# Patient Record
Sex: Female | Born: 2000 | Race: White | Hispanic: No | Marital: Single | Smoking: Current every day smoker
Health system: Southern US, Community
[De-identification: ages and names within clinical notes are randomized; demographics above are authoritative.]

## PROBLEM LIST (undated history)

## (undated) DIAGNOSIS — Z789 Other specified health status: Secondary | ICD-10-CM

## (undated) DIAGNOSIS — F419 Anxiety disorder, unspecified: Secondary | ICD-10-CM

## (undated) DIAGNOSIS — F32A Depression, unspecified: Secondary | ICD-10-CM

## (undated) HISTORY — PX: OTHER SURGICAL HISTORY: SHX169

## (undated) HISTORY — DX: Anxiety disorder, unspecified: F41.9

## (undated) HISTORY — DX: Depression, unspecified: F32.A

## (undated) HISTORY — DX: Other specified health status: Z78.9

---
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
## 2022-05-01 IMAGING — DX DG ABDOMEN 1V
1 series · 1 of 1 positions shown · non-contrast
Comparison: None.

CLINICAL DATA: Lower abdominal pain.

EXAM:
ABDOMEN - 1 VIEW

[abdomen supine]
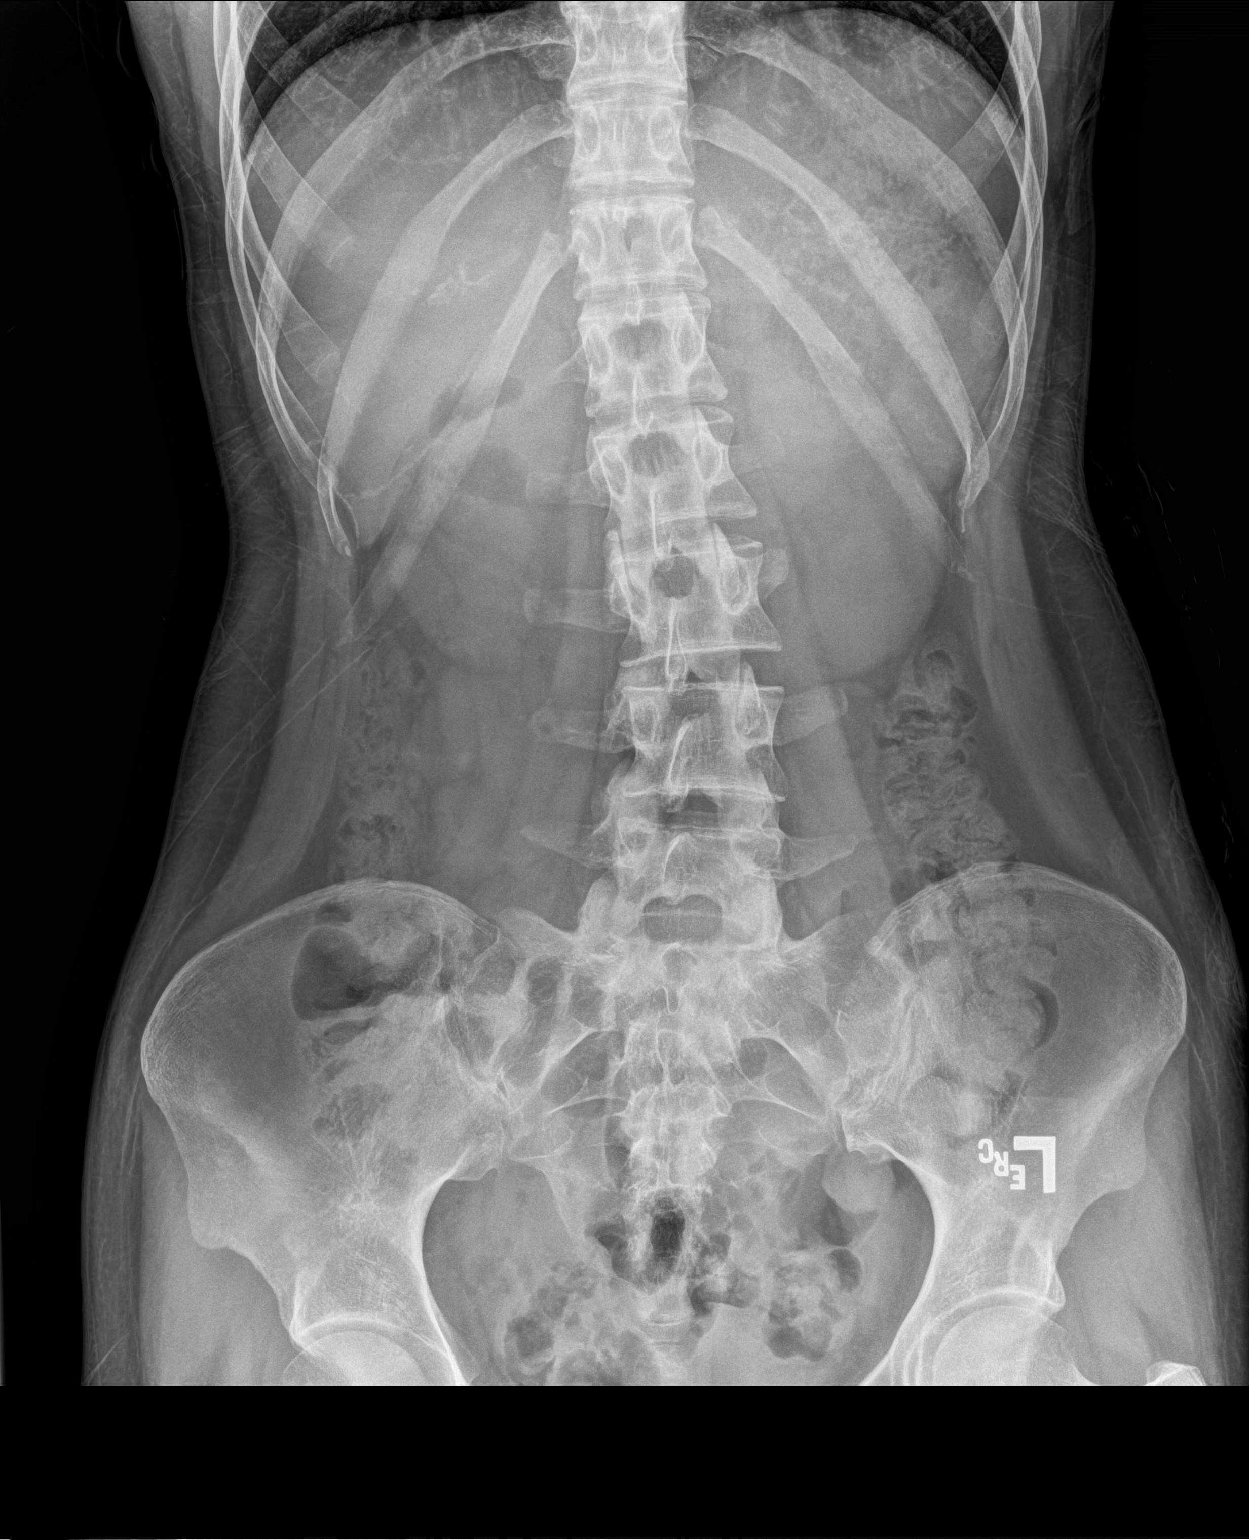

[1 of 1 positions shown; findings below may reference images not displayed]

FINDINGS: Normal bowel gas pattern. No evidence of free air. Small volume of
colonic stool. No radiopaque calculi or abnormal soft tissue
calcifications. Lower most lung bases are clear. Slight scoliotic
curvature of the upper lumbar spine. No acute osseous abnormalities.
IMPRESSION: Unremarkable radiograph of the abdomen.

## 2022-05-01 IMAGING — US US PELVIS COMPLETE
1 series · 14 of 25 positions shown · non-contrast
Comparison: Corresponding radiograph from the same day.

CLINICAL DATA: Initial evaluation for acute right lower quadrant
abdominal pain.

EXAM:
TRANSABDOMINAL ULTRASOUND OF PELVIS
DOPPLER ULTRASOUND OF OVARIES
TECHNIQUE: Transabdominal ultrasound examination of the pelvis was performed
including evaluation of the uterus, ovaries, adnexal regions, and
pelvic cul-de-sac.
Color and duplex Doppler ultrasound was utilized to evaluate blood
flow to the ovaries.

[Series 1002: gyn us · 14 of 40 slices shown]
[im 1/40]
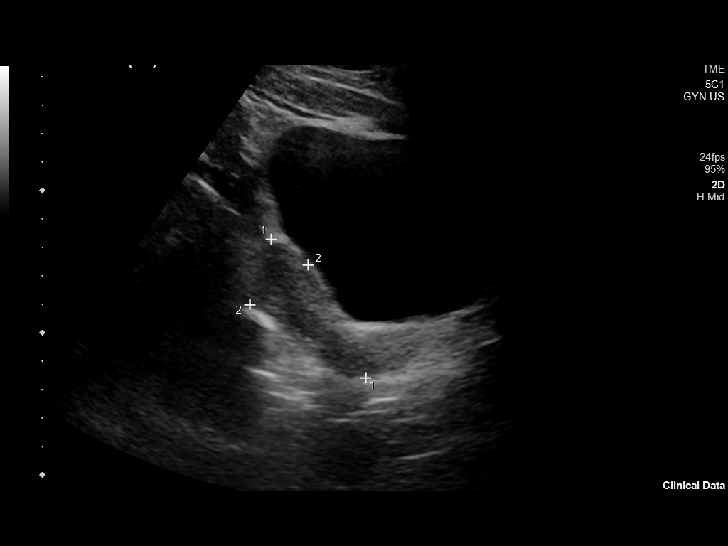
[im 4/40]
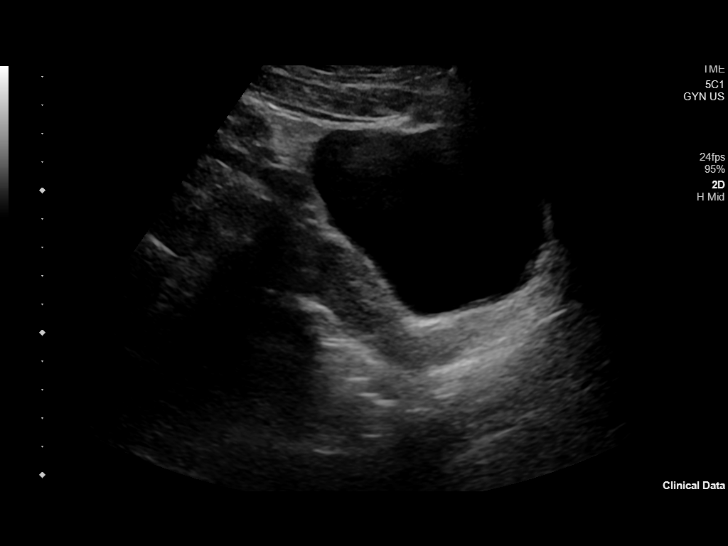
[im 7/40]
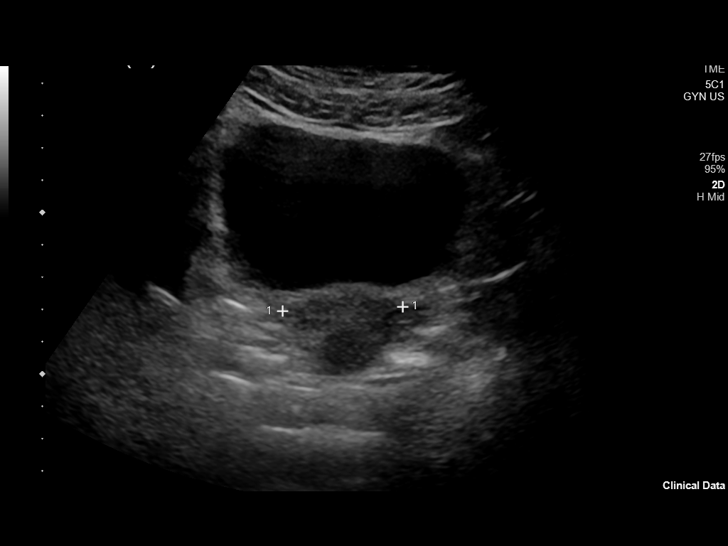
[im 10/40]
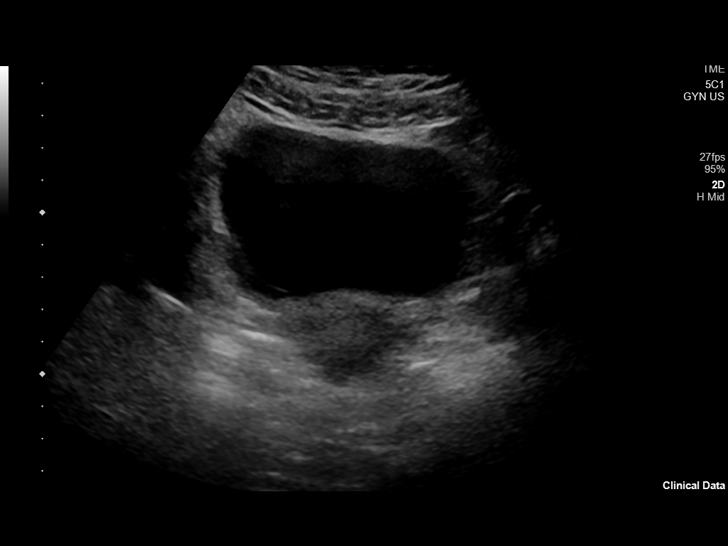
[im 14/40]
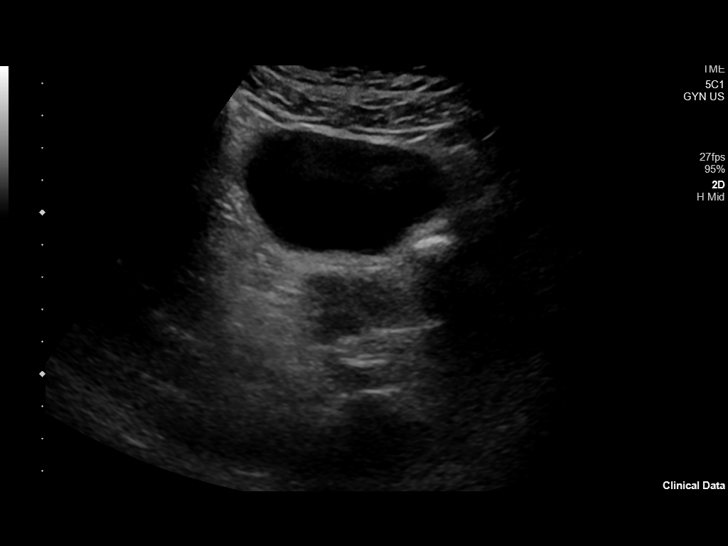
[im 15/40]
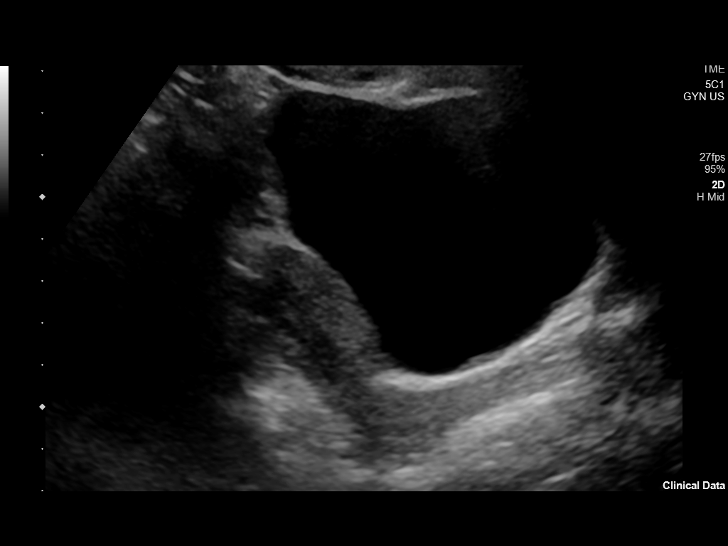
[im 18/40]
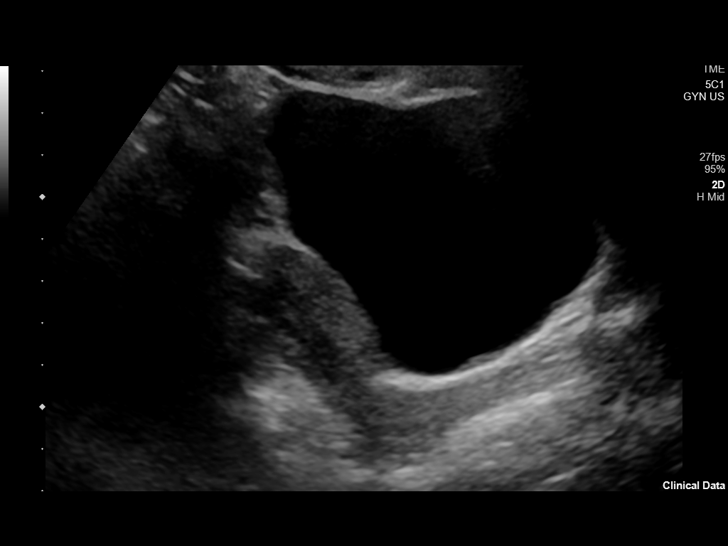
[im 22/40]
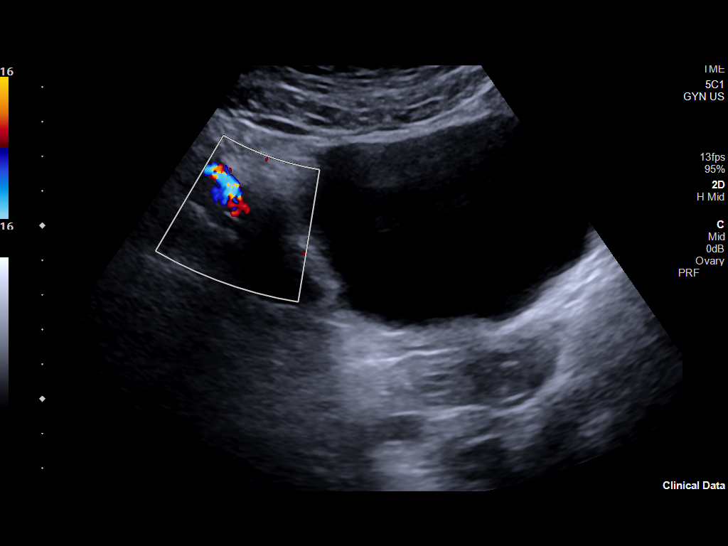
[im 25/40]
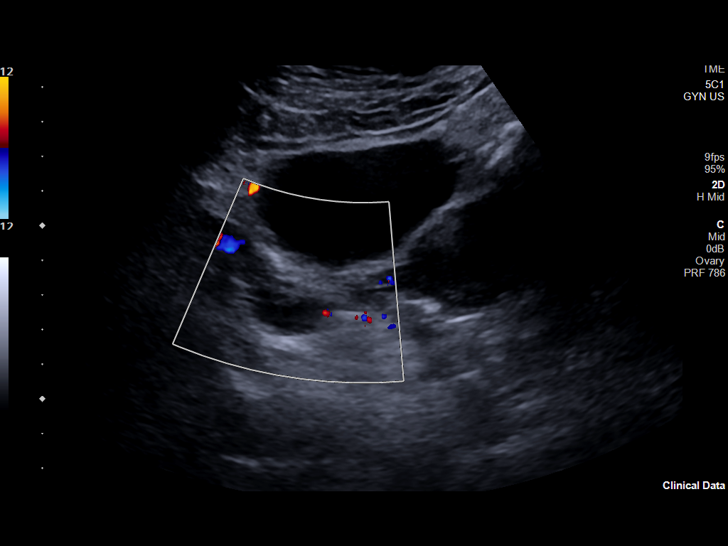
[im 27/40]
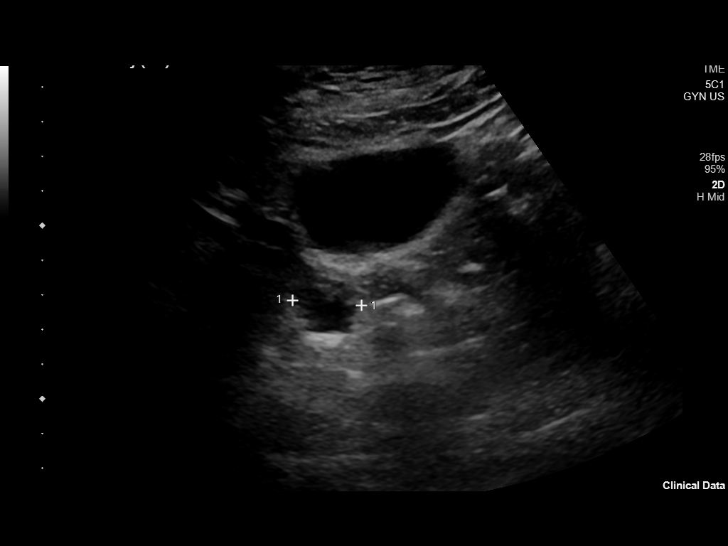
[im 30/40]
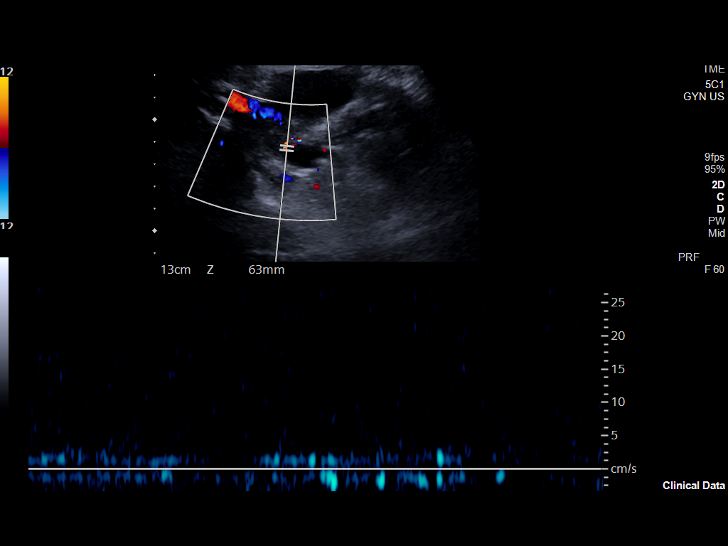
[im 33/40]
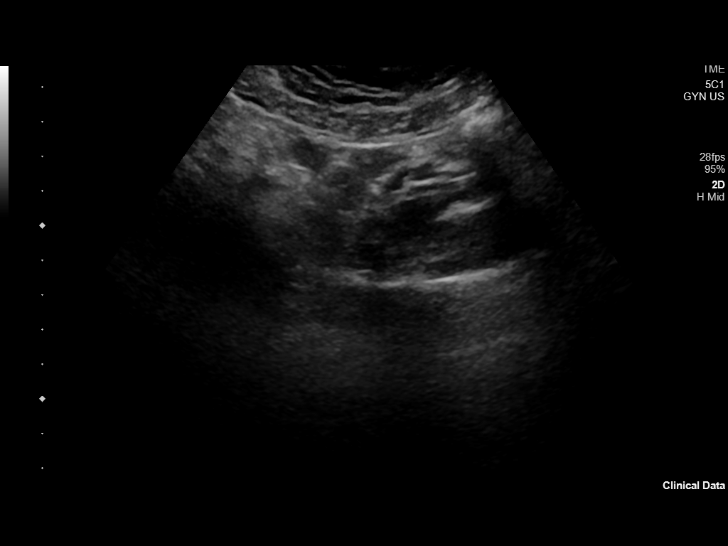
[im 36/40]
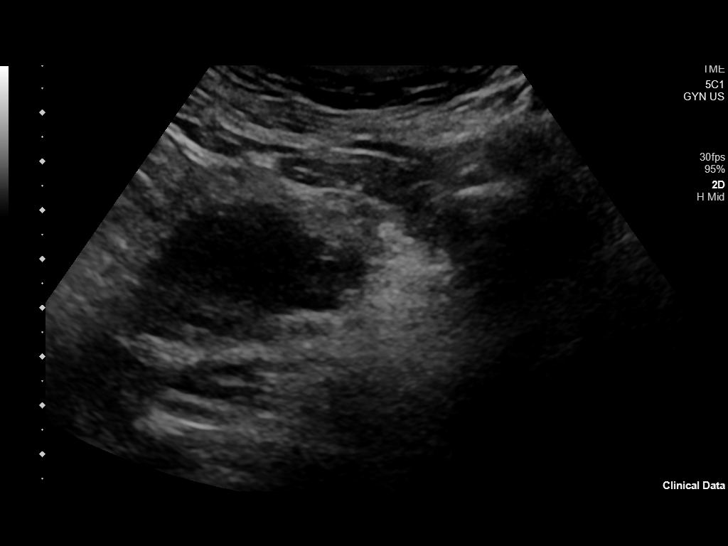
[im 40/40]
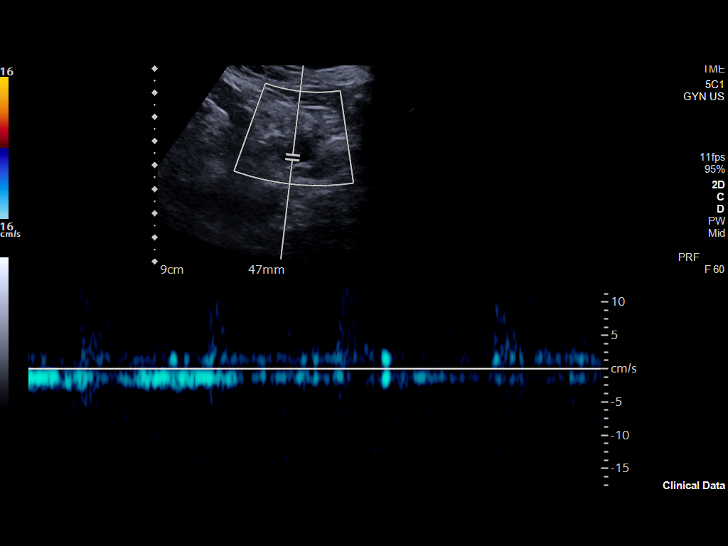

[14 of 25 positions shown; findings below may reference images not displayed]

FINDINGS: Uterus

Measurements: 5.9 x 2.5 x 3.7 cm = volume: 28.5 mL. No fibroids or
other mass visualized.

Endometrium

Thickness: 3.4 mm.  No focal abnormality visualized.

Right ovary

Measurements: 2.3 x 1.3 x 2.0 cm = volume: 3.2 mL. Normal
appearance/no adnexal mass.

Left ovary

Measurements: 2.1 x 1.8 x 1.6 cm = volume: 3.1 mL. Normal
appearance/no adnexal mass.

Pulsed Doppler evaluation demonstrates normal low-resistance
arterial and venous waveforms in both ovaries.

Other: No free fluid seen within the pelvis.
IMPRESSION: Normal pelvic ultrasound. No evidence for torsion or other acute
abnormality.
